# Patient Record
Sex: Male | Born: 1970 | Race: White | Hispanic: No | Marital: Married
Health system: Southern US, Community
[De-identification: ages and names within clinical notes are randomized; demographics above are authoritative.]

---

## 2013-10-01 ENCOUNTER — Other Ambulatory Visit: Payer: Self-pay | Admitting: Family Medicine

## 2013-10-01 ENCOUNTER — Ambulatory Visit
Admission: RE | Admit: 2013-10-01 | Discharge: 2013-10-01 | Disposition: A | Discharge status: Home / Self Care | Payer: No Typology Code available for payment source | Source: Ambulatory Visit | Attending: Family Medicine | Admitting: Family Medicine

## 2013-10-01 DIAGNOSIS — Z0000 Encounter for general adult medical examination without abnormal findings: Secondary | ICD-10-CM

## 2015-01-30 IMAGING — CR DG CHEST 2V
2 series · 2 of 2 positions shown · non-contrast
Comparison: None.

CLINICAL DATA: 43-year-old male undergoing physical exam. Initial
encounter.

EXAM:
CHEST  2 VIEW

[w chest pa]
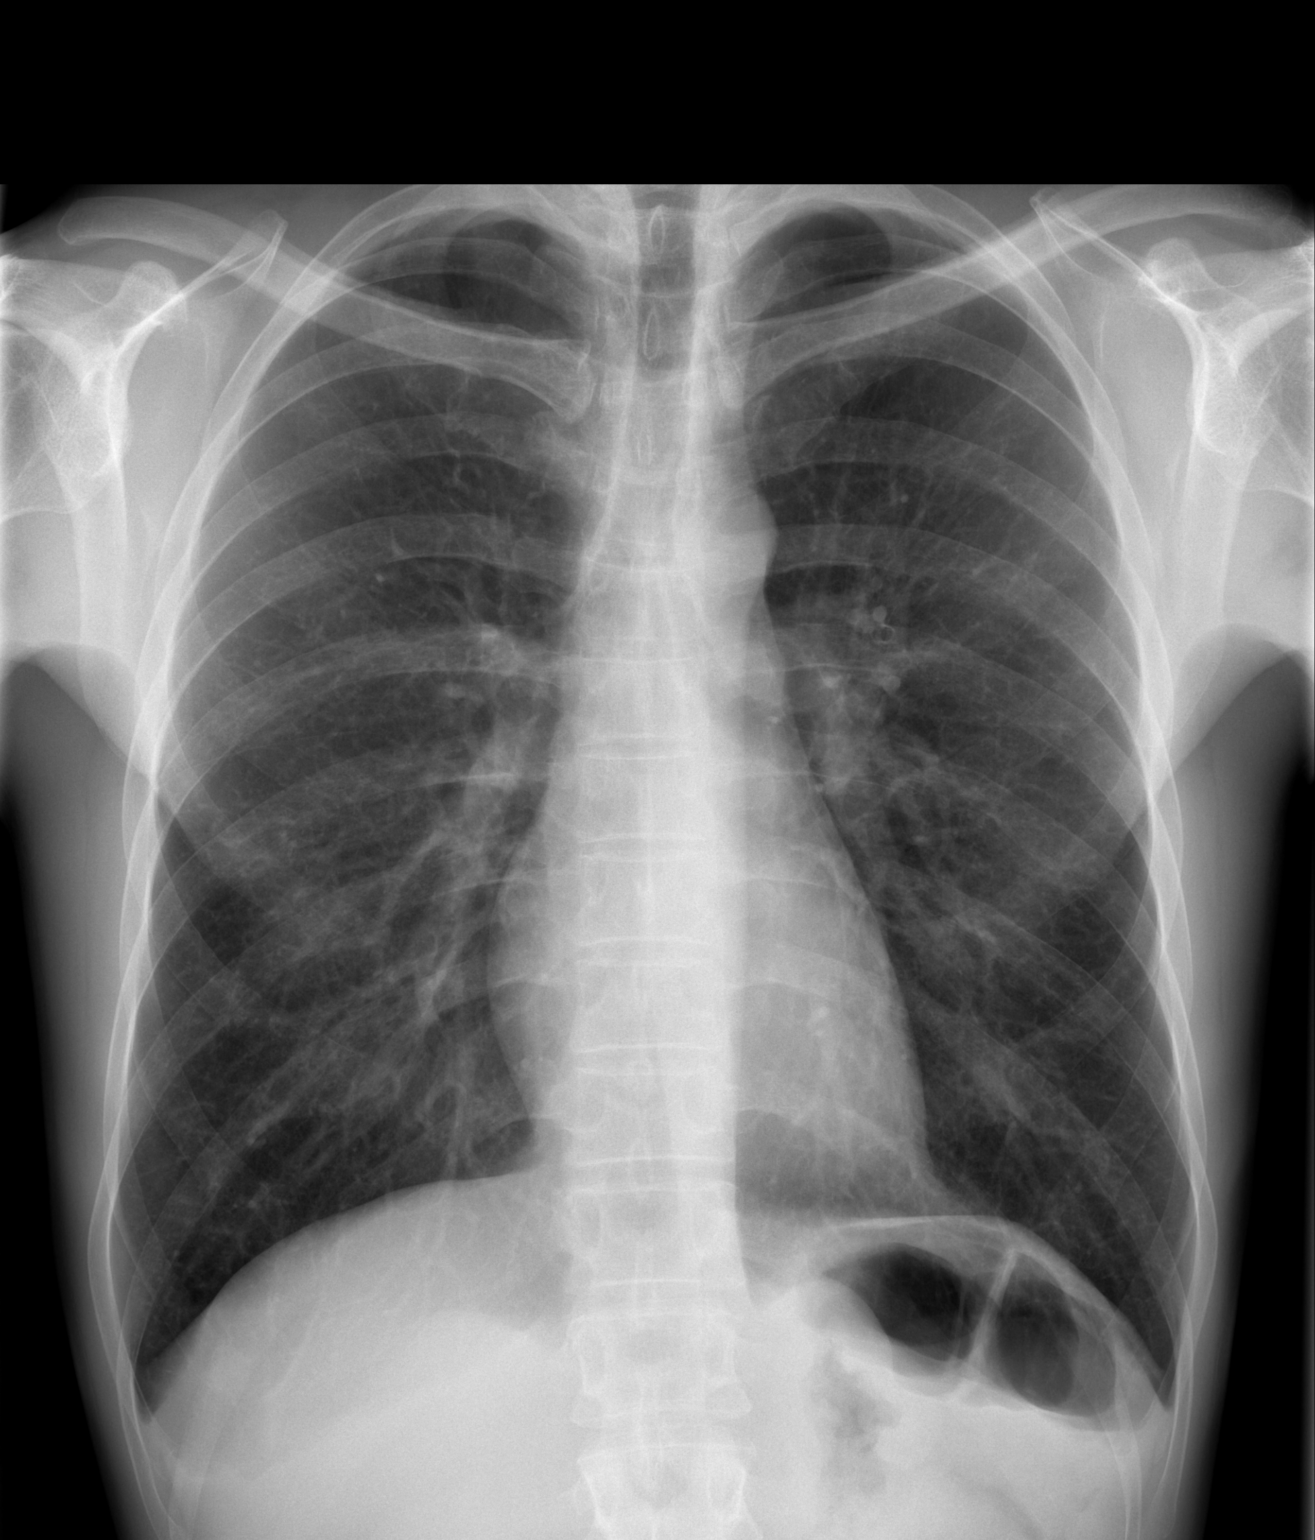

[w chest lat]
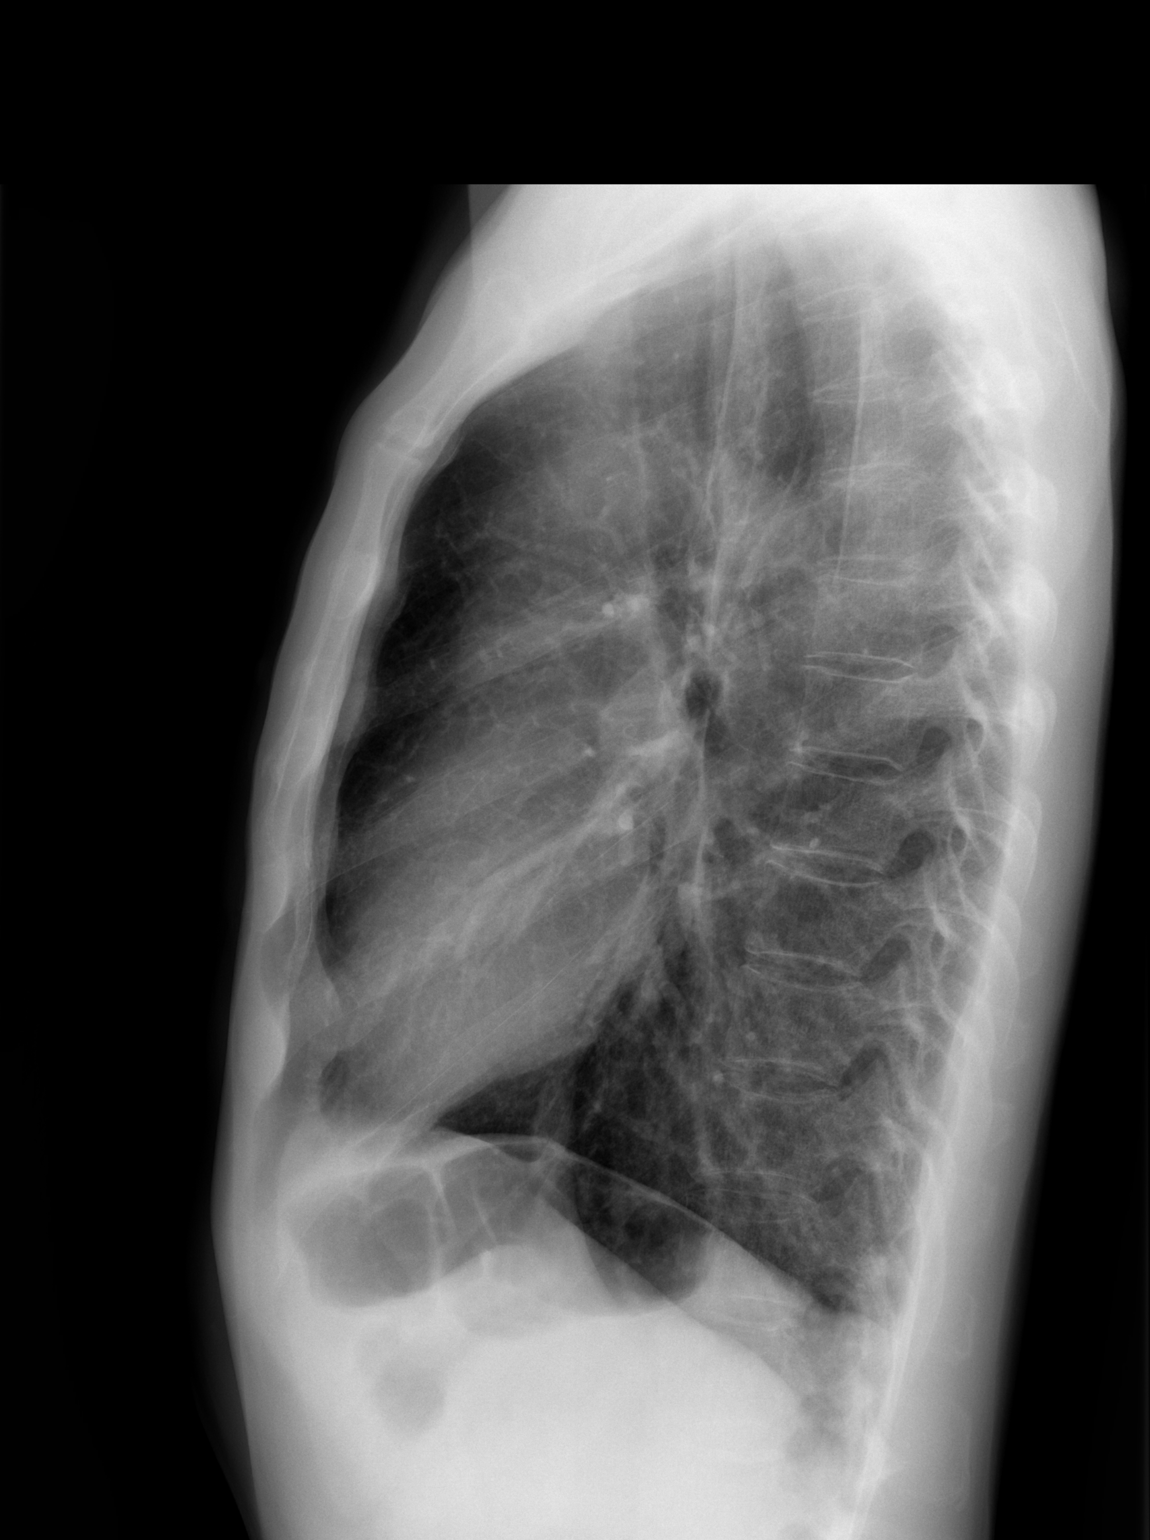

[2 of 2 positions shown; findings below may reference images not displayed]

FINDINGS: Normal lung volumes. Normal cardiac size and mediastinal contours.
Visualized tracheal air column is within normal limits. Lung
parenchyma within normal limits. No pneumothorax or effusion. No
osseous abnormality identified.
IMPRESSION: Negative, no acute cardiopulmonary abnormality.

## 2023-01-04 DEATH — deceased
